# Patient Record
Sex: Male | Born: 1980 | Race: White | Hispanic: No | Marital: Married
Health system: Southern US, Community
[De-identification: ages and names within clinical notes are randomized; demographics above are authoritative.]

---
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
## 2008-12-05 ENCOUNTER — Emergency Department (HOSPITAL_COMMUNITY): Admission: EM | Admit: 2008-12-05 | Discharge: 2008-12-05 | Payer: Self-pay | Admitting: Emergency Medicine

## 2010-04-27 LAB — COMPREHENSIVE METABOLIC PANEL
Alkaline Phosphatase: 47 U/L (ref 39–117)
BUN: 16 mg/dL (ref 6–23)
CO2: 27 mEq/L (ref 19–32)
Chloride: 103 mEq/L (ref 96–112)
Creatinine, Ser: 1.04 mg/dL (ref 0.4–1.5)
GFR calc non Af Amer: >60 mL/min (ref >60)
Glucose, Bld: 114 mg/dL — ABNORMAL HIGH (ref 70–99)
Potassium: 3.9 mEq/L (ref 3.5–5.1)
Total Bilirubin: 0.9 mg/dL (ref 0.3–1.2)

## 2010-04-27 LAB — URINE CULTURE: Colony Count: >=100000

## 2010-04-27 LAB — CBC
HCT: 43 % (ref 39.0–52.0)
Hemoglobin: 15.1 g/dL (ref 13.0–17.0)
MCV: 92.8 fL (ref 78.0–100.0)
WBC: 6.7 10*3/uL (ref 4.0–10.5)

## 2010-04-27 LAB — DIFFERENTIAL
Basophils Absolute: 0 10*3/uL (ref 0.0–0.1)
Basophils Relative: 0 % (ref 0–1)
Monocytes Absolute: 0.5 10*3/uL (ref 0.1–1.0)
Neutro Abs: 3.2 10*3/uL (ref 1.7–7.7)
Neutrophils Relative %: 48 % (ref 43–77)

## 2010-04-27 LAB — URINALYSIS, ROUTINE W REFLEX MICROSCOPIC
Hgb urine dipstick: NEGATIVE
Nitrite: NEGATIVE
Protein, ur: NEGATIVE mg/dL
Urobilinogen, UA: 1 mg/dL (ref 0.0–1.0)

## 2010-04-27 LAB — URINE MICROSCOPIC-ADD ON

## 2010-04-27 LAB — LIPASE, BLOOD: Lipase: 31 U/L (ref 11–59)

## 2011-04-29 IMAGING — CR DG ABDOMEN ACUTE W/ 1V CHEST
4 series · 4 of 4 positions shown · non-contrast
Comparison: None

CLINICAL DATA: Syncope.  Abdominal pain.

ACUTE ABDOMEN SERIES (ABDOMEN 2 VIEW & CHEST 1 VIEW)

[w chest pa]
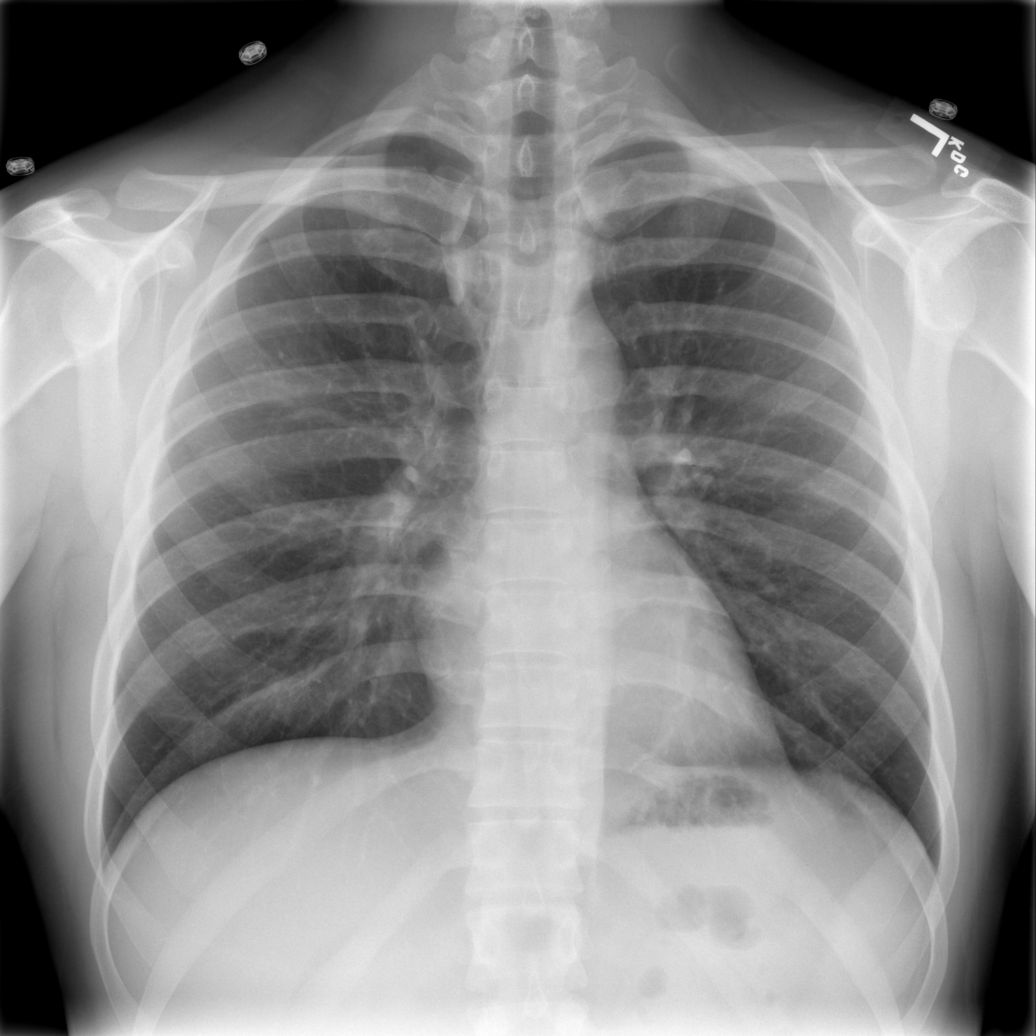

[w abdomen upright]
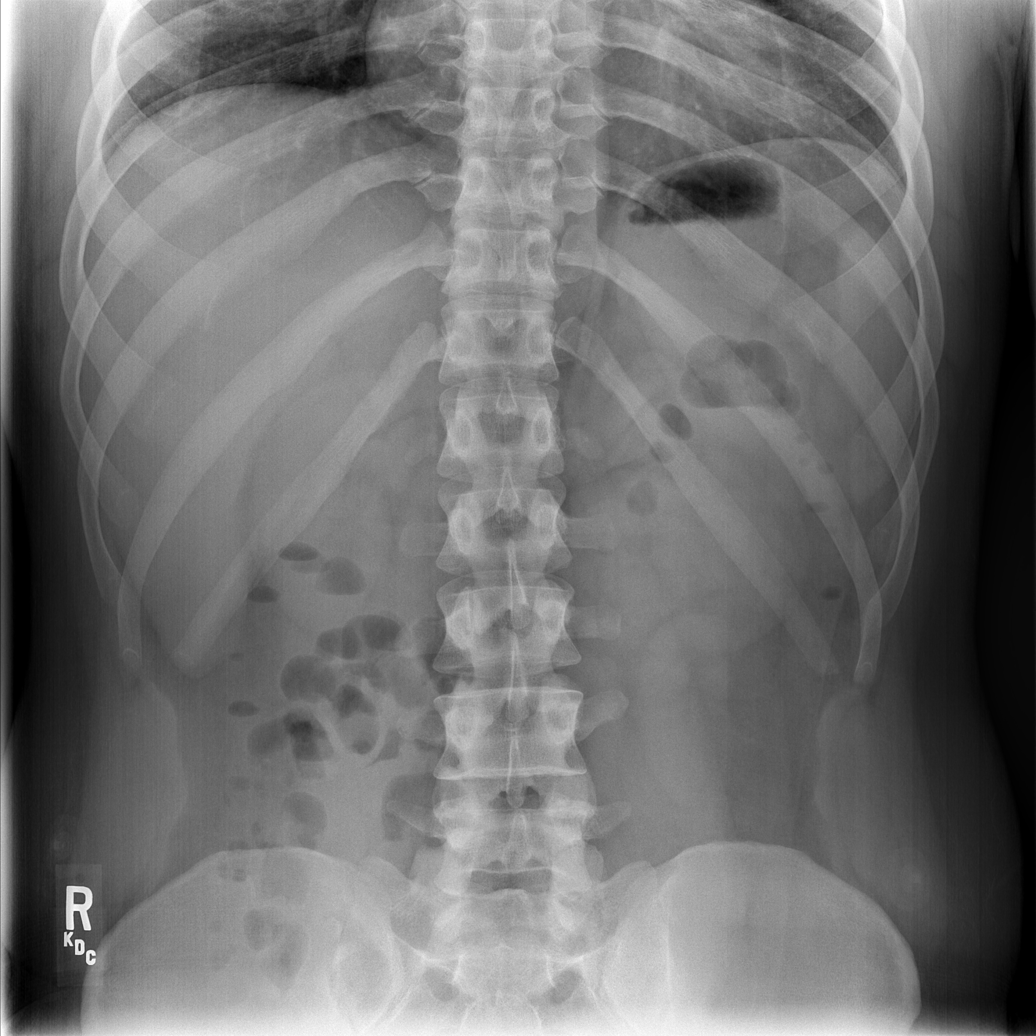

[t abdomen supine (1 of 2)]
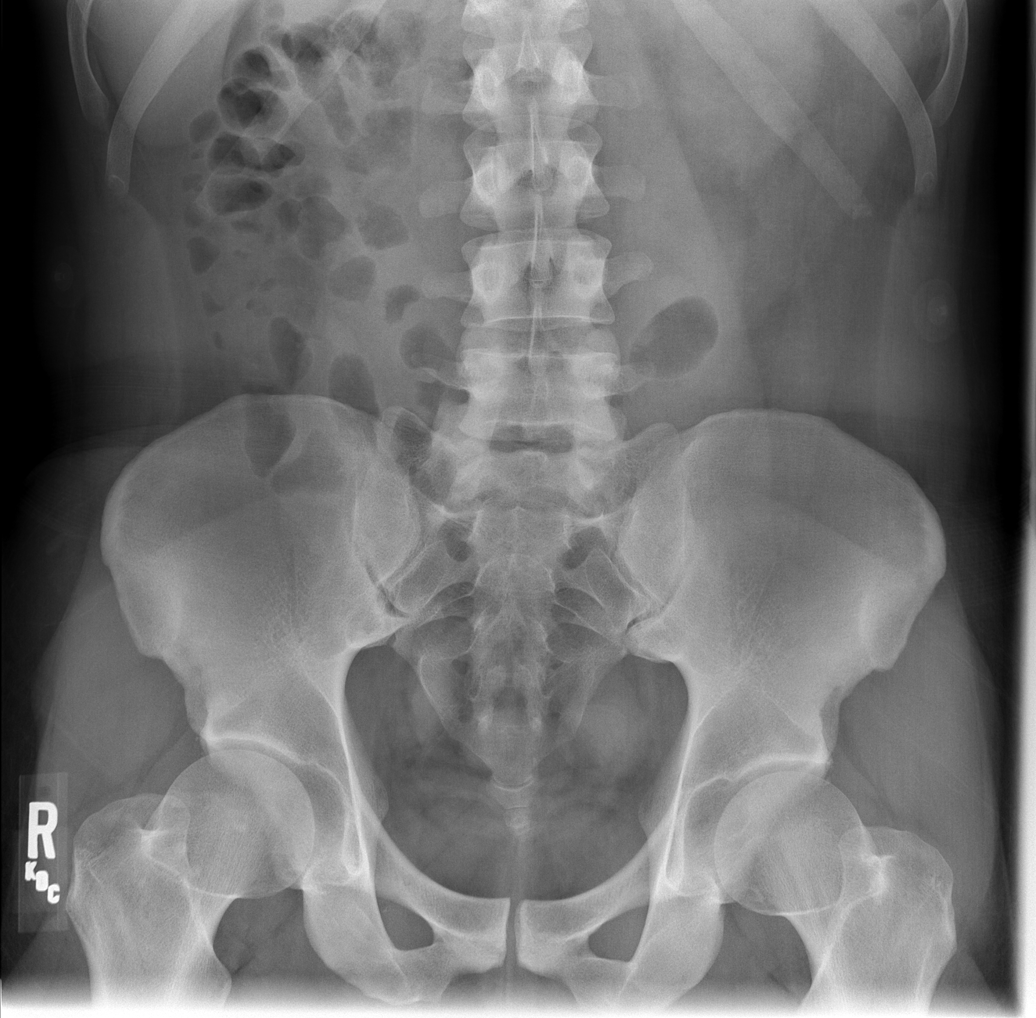

[t abdomen supine (2 of 2)]
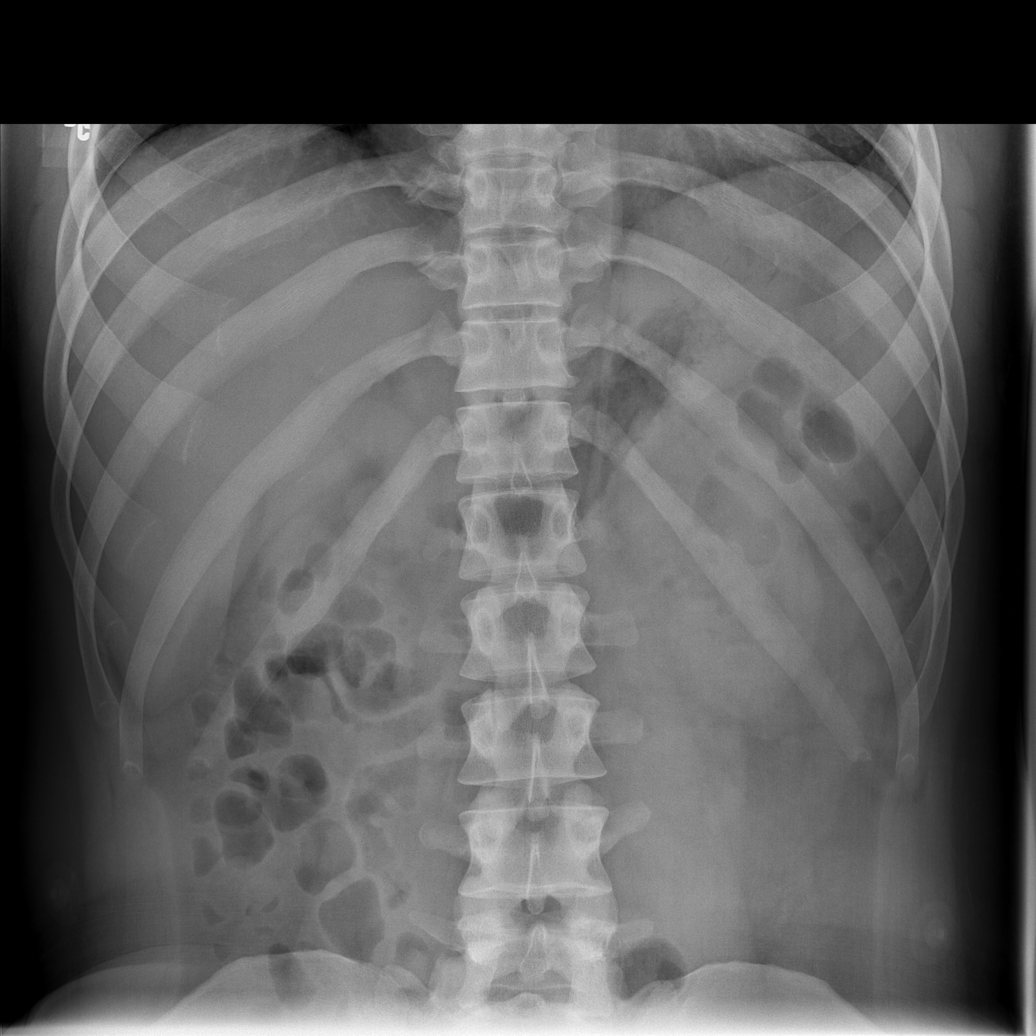

[4 of 4 positions shown; findings below may reference images not displayed]

FINDINGS: Cardiac and mediastinal contours appear normal.

The lungs appear clear.

No pleural effusion is identified.  No free intraperitoneal gas
noted.

Scattered air-fluid levels are present throughout nondilated bowel,
including the descending colon.  This can be seen in the setting of
diarrheal process or ileus/colitis.  No definite dilated bowel to
suggest obstruction.
IMPRESSION: Abnormal air-fluid levels scattered in the colon and potentially
nondilated small bowel.  Differential diagnostic considerations
include diarrheal process, colitis, or ileus.

## 2019-03-20 ENCOUNTER — Ambulatory Visit: Payer: Self-pay | Attending: Family

## 2019-03-20 DIAGNOSIS — Z23 Encounter for immunization: Secondary | ICD-10-CM | POA: Insufficient documentation

## 2019-03-20 NOTE — Progress Notes (Signed)
   Covid-[facility] Vaccination [facility]  Name:  DIVONTE SENGER    MRN: [id number] DOB: 1980/08/08  03/20/2019  Mr. Kempker was observed post Covid-19 immunization for 15 minutes without incidence. He was provided with Vaccine Information Sheet and instruction to access the V-Safe system.   Mr. Mcmanaman was instructed to call 911 with any severe reactions post vaccine: Marland Kitchen Difficulty breathing  . Swelling of your face and throat  . A fast heartbeat  . A bad rash all over your body  . Dizziness and weakness    Immunizations Administered    Name Date Dose VIS Date Route   Moderna COVID-19 Vaccine 03/20/2019  3:22 PM 0.5 mL 12/24/2018 Intramuscular   Manufacturer: Moderna   Lot: [id number]   NDC: 22840-698-61

## 2019-04-21 ENCOUNTER — Ambulatory Visit: Payer: Commercial Managed Care - PPO | Attending: Internal Medicine

## 2019-04-21 DIAGNOSIS — Z20822 Contact with and (suspected) exposure to covid-19: Secondary | ICD-10-CM | POA: Insufficient documentation

## 2019-04-22 ENCOUNTER — Ambulatory Visit: Payer: Self-pay

## 2019-04-22 LAB — NOVEL CORONAVIRUS, NAA: SARS-CoV-2, NAA: NOT DETECTED

## 2019-04-22 LAB — SARS-COV-2, NAA 2 DAY TAT

## 2019-04-29 ENCOUNTER — Ambulatory Visit: Payer: Self-pay | Attending: Family

## 2019-04-29 DIAGNOSIS — Z23 Encounter for immunization: Secondary | ICD-10-CM

## 2019-04-29 NOTE — Progress Notes (Signed)
   Covid-19 Vaccination Clinic  Name:  Daniel Graves    MRN: [id number] DOB: [birth year]  04/29/2019  [name] was observed post Covid-19 immunization for 15 minutes without incident. He was provided with Vaccine Information Sheet and instruction to access the V-Safe system.   [name] was instructed to call 911 with any severe reactions post vaccine: Marland Kitchen Difficulty breathing  . Swelling of face and throat  . A fast heartbeat  . A bad rash all over body  . Dizziness and weakness   Immunizations Administered    Name Date Dose VIS Date Route   Moderna COVID-19 Vaccine 04/29/2019  4:20 PM 0.5 mL 12/24/2018 Intramuscular   Manufacturer: Moderna   Lot: [id number]   NDC: 98001-239-35

## 2020-01-13 ENCOUNTER — Other Ambulatory Visit (HOSPITAL_BASED_OUTPATIENT_CLINIC_OR_DEPARTMENT_OTHER): Payer: Self-pay | Admitting: Internal Medicine

## 2020-01-13 ENCOUNTER — Ambulatory Visit: Payer: Commercial Managed Care - PPO | Attending: Internal Medicine

## 2020-01-13 DIAGNOSIS — Z23 Encounter for immunization: Secondary | ICD-10-CM

## 2020-01-13 NOTE — Progress Notes (Signed)
° °  Covid-19 Vaccination Clinic  Name:  Daniel Graves    MRN: [id number] DOB: [birth year]  01/13/2020  [name] was observed post Covid-19 immunization for 15 minutes without incident. He was provided with Vaccine Information Sheet and instruction to access the V-Safe system.   [name] was instructed to call 911 with any severe reactions post vaccine:  Difficulty breathing   Swelling of face and throat   A fast heartbeat   A bad rash all over body   Dizziness and weakness   Immunizations Administered    Name Date Dose VIS Date Route   Pfizer COVID-19 Vaccine 01/13/2020  9:35 AM 0.3 mL 11/12/2019 Intramuscular   Manufacturer: [employer]   Lot: [id number]   NDC: [id number]

## 2020-01-15 MED FILL — PFIZER-BIONTECH COVID-19 VA: 30 | 1 days supply | Qty: 0 | Fill #0
